# Patient Record
Sex: Male | Born: 1975 | Race: White | Hispanic: No | Marital: Single | Smoking: Current every day smoker
Health system: Southern US, Community
[De-identification: ages and names within clinical notes are randomized; demographics above are authoritative.]

## PROBLEM LIST (undated history)

## (undated) DIAGNOSIS — T07XXXA Unspecified multiple injuries, initial encounter: Secondary | ICD-10-CM

## (undated) DIAGNOSIS — F32A Depression, unspecified: Secondary | ICD-10-CM

## (undated) DIAGNOSIS — F419 Anxiety disorder, unspecified: Secondary | ICD-10-CM

## (undated) DIAGNOSIS — F329 Major depressive disorder, single episode, unspecified: Secondary | ICD-10-CM

## (undated) HISTORY — PX: NO PAST SURGERIES: SHX2092

## (undated) HISTORY — DX: Unspecified multiple injuries, initial encounter: T07.XXXA

## (undated) HISTORY — DX: Depression, unspecified: F32.A

## (undated) HISTORY — DX: Major depressive disorder, single episode, unspecified: F32.9

## (undated) HISTORY — DX: Anxiety disorder, unspecified: F41.9

---

## 2011-10-03 ENCOUNTER — Emergency Department (HOSPITAL_COMMUNITY)
Admission: EM | Admit: 2011-10-03 | Discharge: 2011-10-03 | Disposition: A | Discharge status: Home / Self Care | Payer: Self-pay | Attending: Emergency Medicine | Admitting: Emergency Medicine

## 2011-10-03 ENCOUNTER — Encounter (HOSPITAL_COMMUNITY): Payer: Self-pay | Admitting: Emergency Medicine

## 2011-10-03 DIAGNOSIS — S61409A Unspecified open wound of unspecified hand, initial encounter: Secondary | ICD-10-CM | POA: Insufficient documentation

## 2011-10-03 DIAGNOSIS — T148XXA Other injury of unspecified body region, initial encounter: Secondary | ICD-10-CM

## 2011-10-03 DIAGNOSIS — Z23 Encounter for immunization: Secondary | ICD-10-CM | POA: Insufficient documentation

## 2011-10-03 DIAGNOSIS — Y92009 Unspecified place in unspecified non-institutional (private) residence as the place of occurrence of the external cause: Secondary | ICD-10-CM | POA: Insufficient documentation

## 2011-10-03 DIAGNOSIS — IMO0001 Reserved for inherently not codable concepts without codable children: Secondary | ICD-10-CM | POA: Insufficient documentation

## 2011-10-03 DIAGNOSIS — F17200 Nicotine dependence, unspecified, uncomplicated: Secondary | ICD-10-CM | POA: Insufficient documentation

## 2011-10-03 MED ORDER — TETANUS-DIPHTH-ACELL PERTUSSIS 5-2.5-18.5 LF-MCG/0.5 IM SUSP
0.5000 mL | Freq: Once | INTRAMUSCULAR | Status: AC
  Administered 2011-10-03: 0.5 mL via INTRAMUSCULAR
  Filled 2011-10-03: qty 0.5

## 2011-10-03 MED ORDER — AMOXICILLIN-POT CLAVULANATE 875-125 MG PO TABS: 1.0000 | ORAL_TABLET | Freq: Two times a day (BID) | ORAL | Status: DC

## 2011-10-03 MED ORDER — AMOXICILLIN-POT CLAVULANATE 875-125 MG PO TABS
1.0000 | ORAL_TABLET | Freq: Once | ORAL | Status: AC
  Administered 2011-10-03: 1 via ORAL
  Filled 2011-10-03: qty 1

## 2011-10-03 NOTE — ED Notes (Signed)
Pt sts was bitten by stray cat on Saturday to right hand; pt with some swelling and pain to palm of hand; pt sts unknown rabies status of stray but pt sts he will refuse the rabies series

## 2011-10-03 NOTE — ED Notes (Signed)
Pt here for cat bite to right hand.

## 2011-10-03 NOTE — ED Provider Notes (Signed)
History  This chart was scribed for Joya Gaskins, MD by Ladona Ridgel Day. This patient was seen in room TR11C/TR11C and the patient's care was started at 1325.   CSN: [id number]  Arrival date & time 10/03/11  1235   None     Chief Complaint  Patient presents with  . Animal Bite   HPI Francisco Lawrence is a 36 y.o. male who presents to the Emergency Department complaining of a cat bite 4 days ago after his daughter brought a local cat inside their house and he got bit while trying to chase it down. He states swelling, erythema, and tenderness to his right hand. His TD vaccine is not UTD. He denies any cough, fever, emesis or weakness. He has no medical history.  PMH - none  History reviewed. No pertinent past surgical history.  History reviewed. No pertinent family history.  History  Substance Use Topics  . Smoking status: Current Every Day Smoker  . Smokeless tobacco: Not on file  . Alcohol Use: Yes     occ      Review of Systems  Constitutional: Negative for fever.  Gastrointestinal: Negative for vomiting.    Allergies  Hydrocodone and Oxycodone  Home Medications   Current Outpatient Rx  Name Route Sig Dispense Refill  . IBUPROFEN 200 MG PO TABS Oral Take 400 mg by mouth every 4 (four) hours as needed. For pain      Triage Vitals: BP 145/97  Pulse 70  Temp 98.2 F (36.8 C) (Oral)  Resp 18  SpO2 99%  Physical Exam CONSTITUTIONAL: Well developed/well nourished HEAD AND FACE: Normocephalic/atraumatic EYES: EOMI/PERRL ENMT: Mucous membranes moist NECK: supple no meningeal signs SPINE:entire spine nontender CV: S1/S2 noted, no murmurs/rubs/gallops noted LUNGS: Lungs are clear to auscultation bilaterally, no apparent distress ABDOMEN: soft, nontender, no rebound or guarding GU:no cva tenderness NEURO: Pt is awake/alert, moves all extremitiesx4 EXTREMITIES: pulses normal, minimal pain with ROM of right index finger SKIN: warm, color normal, Erythema/edema to  palmar aspect of right hand just below right 2nd MCP.  No focal abscess noted.  Small bite marks noted.  No crepitance.  No erythematous streaking.  No pain with passive flex/extension of right index finger PSYCH: no abnormalities of mood noted ED Course  Procedures  DIAGNOSTIC STUDIES: Oxygen Saturation is 99% on room air, normal by my interpretation.    COORDINATION OF CARE: At 200 PM Patient refuses to have rabies series for cat bite on his hand and that he needs to leave ED to pickup his daughter so at this time we cannot finish full patient workup. Discussed risks and consequences of not having rabies series, patient understands. Discussed treatment plan with patient which includes antibiotics and TD vaccine and to return to ED for full workup of his infected hand. Patient agrees. Will need full hand xray and hand consultation when he returns to the ED    MDM  Nursing notes including past medical history and social history reviewed and considered in documentation   I personally performed the services described in this documentation, which was scribed in my presence. The recorded information has been reviewed and considered.          Joya Gaskins, MD 10/03/11 1451

## 2011-10-04 ENCOUNTER — Emergency Department (HOSPITAL_COMMUNITY): Payer: Self-pay

## 2011-10-04 ENCOUNTER — Encounter (HOSPITAL_COMMUNITY): Payer: Self-pay | Admitting: Neurology

## 2011-10-04 ENCOUNTER — Emergency Department (HOSPITAL_COMMUNITY)
Admission: EM | Admit: 2011-10-04 | Discharge: 2011-10-04 | Discharge status: Against Medical Advice | Payer: Self-pay | Attending: Emergency Medicine | Admitting: Emergency Medicine

## 2011-10-04 DIAGNOSIS — L089 Local infection of the skin and subcutaneous tissue, unspecified: Secondary | ICD-10-CM

## 2011-10-04 DIAGNOSIS — Z23 Encounter for immunization: Secondary | ICD-10-CM | POA: Insufficient documentation

## 2011-10-04 DIAGNOSIS — R509 Fever, unspecified: Secondary | ICD-10-CM | POA: Insufficient documentation

## 2011-10-04 DIAGNOSIS — M7989 Other specified soft tissue disorders: Secondary | ICD-10-CM | POA: Insufficient documentation

## 2011-10-04 DIAGNOSIS — S61409A Unspecified open wound of unspecified hand, initial encounter: Secondary | ICD-10-CM | POA: Insufficient documentation

## 2011-10-04 DIAGNOSIS — IMO0001 Reserved for inherently not codable concepts without codable children: Secondary | ICD-10-CM | POA: Insufficient documentation

## 2011-10-04 MED ORDER — METRONIDAZOLE IN NACL 5-0.79 MG/ML-% IV SOLN
500.0000 mg | Freq: Once | INTRAVENOUS | Status: AC
  Administered 2011-10-04: 500 mg via INTRAVENOUS
  Filled 2011-10-04: qty 100

## 2011-10-04 MED ORDER — ACETAMINOPHEN 650 MG RE SUPP: 650.0000 mg | Freq: Once | RECTAL | Status: DC

## 2011-10-04 MED ORDER — AMOXICILLIN-POT CLAVULANATE 875-125 MG PO TABS: 1.0000 | ORAL_TABLET | Freq: Two times a day (BID) | ORAL | Status: DC

## 2011-10-04 MED ORDER — RABIES VACCINE, PCEC IM SUSR
1.0000 mL | Freq: Once | INTRAMUSCULAR | Status: AC
  Administered 2011-10-04: 1 mL via INTRAMUSCULAR
  Filled 2011-10-04: qty 1

## 2011-10-04 MED ORDER — RABIES IMMUNE GLOBULIN 150 UNIT/ML IM INJ
20.0000 [IU]/kg | INJECTION | Freq: Once | INTRAMUSCULAR | Status: AC
  Administered 2011-10-04: 1125 [IU] via INTRAMUSCULAR
  Filled 2011-10-04: qty 8

## 2011-10-04 MED ORDER — LEVOFLOXACIN IN D5W 500 MG/100ML IV SOLN
500.0000 mg | INTRAVENOUS | Status: DC
  Administered 2011-10-04: 500 mg via INTRAVENOUS
  Filled 2011-10-04: qty 100

## 2011-10-04 MED ORDER — CLINDAMYCIN HCL 150 MG PO CAPS: 150.0000 mg | ORAL_CAPSULE | Freq: Four times a day (QID) | ORAL | Status: DC

## 2011-10-04 NOTE — ED Provider Notes (Signed)
Please see my initial note for the patient's course prior to CDU transfer.  I discussed the case with out orthopedist at 1614 again, after the patient had left (prior to complete evaluation).  Gerhard Munch, MD 10/04/11 [phone]

## 2011-10-04 NOTE — ED Notes (Signed)
Pt reporting cat bite to palm of right hand Friday night. Cat was stray neighborhood cat. Pt has swelling to right pointer finger, extending into the knuckle. Skin is red. Sensation intact. A x 4. NAD

## 2011-10-04 NOTE — ED Notes (Signed)
Called pharmacy to tube medications.

## 2011-10-04 NOTE — ED Notes (Signed)
Rabies shot schedule completed and faxed to [facility], pharmacy x 2 and to RN caring for pt at this time.

## 2011-10-04 NOTE — ED Provider Notes (Signed)
History     CSN: [id number]  Arrival date & time 10/04/11  0850   First MD Initiated Contact with Patient 10/04/11 [phone]      Chief Complaint  Patient presents with  . Animal Bite     HPI The patient presents one day after an initial presentation for concerns of right hand pain.  He sustained a cat bite 5 days ago.  Since that time he said pain persistently on the palmar aspect near the MCP.  He notes that since his presentation is really related to abuse oral antibiotics the swelling and pain is decreased slightly, but he continues to have sharp pain in the area.  He denies any new fever, nausea, vomiting, confusion or disorientation or any other focal complaints. He had to leave prematurely yesterday, due to family needs.  Today he requests completion of care including rabies prophylaxis, continue antibiotics, x-ray. History reviewed. No pertinent past medical history.  History reviewed. No pertinent past surgical history.  No family history on file.  History  Substance Use Topics  . Smoking status: Current Every Day Smoker  . Smokeless tobacco: Not on file  . Alcohol Use: Yes     occ      Review of Systems  Constitutional:       Per HPI, otherwise negative  HENT:       Per HPI, otherwise negative  Eyes: Negative.   Respiratory:       Per HPI, otherwise negative  Cardiovascular:       Per HPI, otherwise negative  Gastrointestinal: Negative for vomiting.  Musculoskeletal:       Per HPI, otherwise negative  Skin: Negative.   Neurological: Negative for weakness and numbness.  Hematological:       No additional bruising    Allergies  Hydrocodone and Oxycodone  Home Medications   Current Outpatient Rx  Name Route Sig Dispense Refill  . IBUPROFEN 200 MG PO TABS Oral Take 400 mg by mouth every 6 (six) hours as needed. For pain      BP 121/67  Pulse 79  Temp 98.4 F (36.9 C) (Oral)  Resp 14  SpO2 98%  Physical Exam  Nursing note and vitals  reviewed. Constitutional: He appears well-developed and well-nourished. No distress.  HENT:  Head: Normocephalic.  Eyes: Conjunctivae normal are normal. Right eye exhibits no discharge. Left eye exhibits no discharge.  Neck: No tracheal deviation present.  Cardiovascular: Normal rate, regular rhythm and intact distal pulses.   Pulmonary/Chest: Effort normal. No stridor. No respiratory distress.  Musculoskeletal:       Arms:   ED Course  Procedures (including critical care time)  Labs Reviewed - No data to display No results found.   No diagnosis found.    MDM  This previously well male presents after a cat bite sustained several days ago.  Notably, the patient was initially evaluated for this complaint yesterday, but had to leave due to family concerns.  Today the patient received IV antibiotics, rabies prophylaxis, and after a x-ray did not show obvious concerns rusty mellitus was transferred to the CDU awaiting hand surgery evaluation to concern for tendinous sheath involvement.  I discussed the case with the mid-level provider in the CDU prior to transfer.  Gerhard Munch, MD 10/04/11 [phone]

## 2011-10-04 NOTE — ED Notes (Signed)
RABIES VACCINE total of 7.5 ml.  0.5-hand;0.5 hand;0.5 hand;0.5 hand= 2 ml 2 ml right thigh; 2 ml left thigh=4 1.5 to left deltoid; TOTAL= 7.5

## 2011-10-04 NOTE — ED Provider Notes (Signed)
Pt to CDU while awaits Dr. Melvyn Novas from Hand to evaluate.  Pt sustained a cat bite to R hand and suffered deep tissue infection.  Pt will be placed on Levaquin and Flagyl IV abx.  Pt has a temp of 102.  Will give acetaminophen suppository, will keep NPO until seen by hand specialist.    2:01 PM Pt has been waiting for several hours but has not seen Dr. Melvyn Novas yet.  He request to leave AMA to pick up his daughter from day care.  I discussed risk/benefit including delay in treatment, prolonged complications, worsening infection.  Pt acknowledge risk.  I will fill AMA form.  WIl prescribe Augmentin as well as pain medication.  Will give number to hand specialist for f/u, and also to encourage to return to ER if sxs worsen.  Pt voice understanding and agrees with plan.  Pt able to make informed decision.    BP 129/72  Pulse 77  Temp 102.1 F (38.9 C) (Oral)  Resp 28  Wt 128 lb (58.06 kg)  SpO2 97%  Nursing notes reviewed and considered in documentation  Previous records reviewed and considered  All labs/vitals reviewed and considered  xrays reviewed and considered   Fayrene Helper, PA-C 10/04/11 1403

## 2011-10-04 NOTE — ED Notes (Signed)
Pt.  Unable to stay and wait for the hand surgeon, due to having to pick up their daughter.  They will call the hand surgeon and follow-up

## 2016-06-21 ENCOUNTER — Emergency Department (HOSPITAL_COMMUNITY)
Admission: EM | Admit: 2016-06-21 | Discharge: 2016-06-21 | Disposition: A | Discharge status: Home / Self Care | Payer: Self-pay | Attending: Emergency Medicine | Admitting: Emergency Medicine

## 2016-06-21 ENCOUNTER — Encounter (HOSPITAL_COMMUNITY): Payer: Self-pay | Admitting: Nurse Practitioner

## 2016-06-21 ENCOUNTER — Emergency Department (HOSPITAL_COMMUNITY): Payer: Self-pay

## 2016-06-21 DIAGNOSIS — K639 Disease of intestine, unspecified: Secondary | ICD-10-CM

## 2016-06-21 DIAGNOSIS — R1031 Right lower quadrant pain: Secondary | ICD-10-CM

## 2016-06-21 DIAGNOSIS — K6389 Other specified diseases of intestine: Secondary | ICD-10-CM | POA: Insufficient documentation

## 2016-06-21 DIAGNOSIS — Z79899 Other long term (current) drug therapy: Secondary | ICD-10-CM | POA: Insufficient documentation

## 2016-06-21 DIAGNOSIS — F17200 Nicotine dependence, unspecified, uncomplicated: Secondary | ICD-10-CM | POA: Insufficient documentation

## 2016-06-21 LAB — URINALYSIS, ROUTINE W REFLEX MICROSCOPIC
BILIRUBIN URINE: NEGATIVE
GLUCOSE, UA: NEGATIVE mg/dL
Hgb urine dipstick: NEGATIVE
KETONES UR: 5 mg/dL — AB
Leukocytes, UA: NEGATIVE
Nitrite: NEGATIVE
Protein, ur: NEGATIVE mg/dL
SPECIFIC GRAVITY, URINE: 1.028 (ref 1.005–1.030)
pH: 5 (ref 5.0–8.0)

## 2016-06-21 LAB — COMPREHENSIVE METABOLIC PANEL
ALK PHOS: 46 U/L (ref 38–126)
ALT: 12 U/L — AB (ref 17–63)
ANION GAP: 9 (ref 5–15)
AST: 17 U/L (ref 15–41)
Albumin: 4.1 g/dL (ref 3.5–5.0)
BUN: 13 mg/dL (ref 6–20)
CHLORIDE: 102 mmol/L (ref 101–111)
CO2: 27 mmol/L (ref 22–32)
Calcium: 9.5 mg/dL (ref 8.9–10.3)
Creatinine, Ser: 1.03 mg/dL (ref 0.61–1.24)
GFR calc non Af Amer: >60 mL/min (ref >60)
GLUCOSE: 81 mg/dL (ref 65–99)
Potassium: 4.2 mmol/L (ref 3.5–5.1)
Sodium: 138 mmol/L (ref 135–145)
Total Bilirubin: 0.6 mg/dL (ref 0.3–1.2)
Total Protein: 6.9 g/dL (ref 6.5–8.1)

## 2016-06-21 LAB — CBC
HCT: 45.6 % (ref 39.0–52.0)
HEMOGLOBIN: 15.8 g/dL (ref 13.0–17.0)
MCH: 33.3 pg (ref 26.0–34.0)
MCHC: 34.6 g/dL (ref 30.0–36.0)
MCV: 96 fL (ref 78.0–100.0)
Platelets: 208 10*3/uL (ref 150–400)
RBC: 4.75 MIL/uL (ref 4.22–5.81)
RDW: 12.8 % (ref 11.5–15.5)
WBC: 10.3 10*3/uL (ref 4.0–10.5)

## 2016-06-21 MED ORDER — IOPAMIDOL (ISOVUE-300) INJECTION 61%
INTRAVENOUS | Status: AC
  Administered 2016-06-21: 100 mL
  Filled 2016-06-21: qty 100

## 2016-06-21 NOTE — ED Triage Notes (Signed)
Pt presents with c/o abdominal pain. The pain is a squeezing pain in his right lower quadrant that has been intermittent over the past three weeks.The pain became so severe last night that he had to leave work. He reports difficulty passing gas, constipation. he's passed small stools but no normal bowel movement for several weeks. He denies fevers, chills, nausea, vomiting, urinary frequency, dysuria, diarrhea. He has not tried anything for the pain at home.

## 2016-06-23 ENCOUNTER — Encounter: Payer: Self-pay | Admitting: Gastroenterology

## 2016-06-23 NOTE — ED Provider Notes (Signed)
[facility] DEPT Provider Note   CSN: [id number] Arrival date & time: 06/21/16  1628     History   Chief Complaint Chief Complaint  Patient presents with  . Abdominal Pain    HPI Francisco Lawrence is a 41 y.o. male.  HPI   41 year old male presents with concern for intermittent squeezing right lower quadrant pain over the last 3 weeks. Reports that the pain has been increasing, and last night it was so severe he had to leave work. Reports it has been difficult to pass flatus, and he's had constipation. He's had small, thin stool but no normal bowel movements for several weeks. Denies nausea, vomiting, dysuria, diarrhea. Denies fevers. Reports his appetite has decreased. He is not tried anything for the pain.  History reviewed. No pertinent past medical history.  There are no active problems to display for this patient.   History reviewed. No pertinent surgical history.     Home Medications    Prior to Admission medications   Medication Sig Start Date End Date Taking? Authorizing Provider  amoxicillin-clavulanate (AUGMENTIN) 875-125 MG per tablet Take 1 tablet by mouth 2 (two) times daily. 10/04/11   Domenic Moras, PA-C  clindamycin (CLEOCIN) 150 MG capsule Take 1 capsule (150 mg total) by mouth every 6 (six) hours. 10/04/11   Domenic Moras, PA-C  ibuprofen (ADVIL,MOTRIN) 200 MG tablet Take 400 mg by mouth every 6 (six) hours as needed. For pain    [provider]    Family History History reviewed. No pertinent family history.  Social History Social History  Substance Use Topics  . Smoking status: Current Every Day Smoker  . Smokeless tobacco: Never Used  . Alcohol use Yes     Comment: occ     Allergies   Hydrocodone and Oxycodone   Review of Systems Review of Systems  Constitutional: Negative for fever.  HENT: Negative for sore throat.   Eyes: Negative for visual disturbance.  Respiratory: Negative for shortness of breath.   Cardiovascular: Negative for  chest pain.  Gastrointestinal: Positive for abdominal pain and constipation. Negative for diarrhea, nausea and vomiting.  Genitourinary: Negative for difficulty urinating and dysuria.  Musculoskeletal: Negative for back pain and neck stiffness.  Skin: Negative for rash.  Neurological: Negative for syncope and headaches.     Physical Exam Updated Vital Signs BP 112/71   Pulse (!) 54   Temp 97.4 F (36.3 C) (Oral)   Resp 16   SpO2 98%   Physical Exam  Constitutional: He is oriented to person, place, and time. He appears well-developed and well-nourished. No distress.  HENT:  Head: Normocephalic and atraumatic.  Eyes: Conjunctivae and EOM are normal.  Neck: Normal range of motion.  Cardiovascular: Normal rate, regular rhythm, normal heart sounds and intact distal pulses.  Exam reveals no gallop and no friction rub.   No murmur heard. Pulmonary/Chest: Effort normal and breath sounds normal. No respiratory distress. He has no wheezes. He has no rales.  Abdominal: Soft. He exhibits no distension. There is tenderness (right sided). There is no guarding.  Musculoskeletal: He exhibits no edema.  Neurological: He is alert and oriented to person, place, and time.  Skin: Skin is warm and dry. He is not diaphoretic.  Nursing note and vitals reviewed.    ED Treatments / Results  Labs (all labs ordered are listed, but only abnormal results are displayed) Labs Reviewed  COMPREHENSIVE METABOLIC PANEL - Abnormal; Notable for the following:       Result Value  ALT 12 (*)    All other components within normal limits  URINALYSIS, ROUTINE W REFLEX MICROSCOPIC - Abnormal; Notable for the following:    APPearance HAZY (*)    Ketones, ur 5 (*)    All other components within normal limits  CBC    EKG  EKG Interpretation None       Radiology Ct Abdomen Pelvis W Contrast  Result Date: 06/21/2016 CLINICAL DATA:  Right-sided abdominal pain. Intermittent constipation. Symptoms for 3  weeks. Clinical concern for intermittent obstruction/mass. EXAM: CT ABDOMEN AND PELVIS WITH CONTRAST TECHNIQUE: Multidetector CT imaging of the abdomen and pelvis was performed using the standard protocol following bolus administration of intravenous contrast. CONTRAST:  150mL ISOVUE-300 IOPAMIDOL (ISOVUE-300) INJECTION 61% COMPARISON:  None. FINDINGS: Lower chest: The lung bases are clear. Hepatobiliary: No focal liver abnormality is seen. No gallstones, gallbladder wall thickening, or biliary dilatation. Pancreas: No ductal dilatation or inflammation. Spleen: Normal in size without focal abnormality. Adrenals/Urinary Tract: Normal adrenal glands. No hydronephrosis or perinephric edema. Homogeneous enhancement with symmetric excretion on delayed phase imaging. 15 mm cyst in the mid left kidney is minimally complex. Subcentimeter hypodensities in the anterior mid left kidney as well as inferior right kidney are too small to characterize. Urinary bladder is minimally distended without wall thickening. Stomach/Bowel: Bowel evaluation is limited given lack of enteric contrast and paucity of intra-abdominal fat. No evidence appendicitis. Air-filled appendix without surrounding inflammation. Ingested material within the stomach. No bowel obstruction or abnormal distention. Majority of the colon is decompressed, particularly sigmoid colon, difficult to exclude sigmoid colonic wall thickening. Vascular/Lymphatic: Circumaortic left renal vein. Abdominal aorta is normal in caliber. No evidence of adenopathy allowing for limitations of paucity of intra-abdominal fat and lack of enteric contrast. Reproductive: Prostate is unremarkable. Other: Trace free fluid in the pelvis is nonspecific. No free air or intra-abdominal abscess. Musculoskeletal: There are no acute or suspicious osseous abnormalities. IMPRESSION: 1. Possible sigmoid colonic wall thickening versus nondistention, difficult to exclude mild colitis. Otherwise no  acute abnormality in the abdomen or pelvis. The appendix is normal. 2. No bowel obstruction. Detailed bowel evaluation is limited in the absence of enteric contrast. Electronically Signed   By: Jeb Levering M.D.   On: 06/21/2016 21:08    Procedures Procedures (including critical care time)  Medications Ordered in ED Medications  iopamidol (ISOVUE-300) 61 % injection (100 mLs  Contrast Given 06/21/16 2031)     Initial Impression / Assessment and Plan / ED Course  I have reviewed the triage vital signs and the nursing notes.  Pertinent labs & imaging results that were available during my care of the patient were reviewed by me and considered in my medical decision making (see chart for details).     41 year old male presents with concern for intermittent right abdominal pain which has been worsening, and associated with constipation decreased flatus. Labs showed no significant changes.   CT shows possible sigmoid colonic wall thickening versus nondistention. Given symptoms, recommend follow-up with gastroenterology for outpatient colonoscopy.  Patient discharged in stable condition with understanding of reasons to return.   Final Clinical Impressions(s) / ED Diagnoses   Final diagnoses:  Right lower quadrant abdominal pain  Sigmoid thickening, colitis versus other    New Prescriptions Discharge Medication List as of 06/21/2016  9:46 PM       Gareth Morgan, MD 06/23/16 0236

## 2016-06-29 ENCOUNTER — Ambulatory Visit (INDEPENDENT_AMBULATORY_CARE_PROVIDER_SITE_OTHER): Payer: Self-pay | Admitting: Gastroenterology

## 2016-06-29 ENCOUNTER — Other Ambulatory Visit (INDEPENDENT_AMBULATORY_CARE_PROVIDER_SITE_OTHER): Payer: Self-pay

## 2016-06-29 ENCOUNTER — Encounter: Payer: Self-pay | Admitting: Gastroenterology

## 2016-06-29 VITALS — BP 116/70 | HR 76 | Ht 67.5 in | Wt 121.0 lb

## 2016-06-29 DIAGNOSIS — R140 Abdominal distension (gaseous): Secondary | ICD-10-CM

## 2016-06-29 DIAGNOSIS — R634 Abnormal weight loss: Secondary | ICD-10-CM

## 2016-06-29 DIAGNOSIS — R630 Anorexia: Secondary | ICD-10-CM

## 2016-06-29 DIAGNOSIS — R1031 Right lower quadrant pain: Secondary | ICD-10-CM

## 2016-06-29 DIAGNOSIS — K648 Other hemorrhoids: Secondary | ICD-10-CM

## 2016-06-29 DIAGNOSIS — K5900 Constipation, unspecified: Secondary | ICD-10-CM

## 2016-06-29 LAB — VITAMIN B12: Vitamin B-12: 569 pg/mL (ref 211–911)

## 2016-06-29 LAB — IBC PANEL
Iron: 69 ug/dL (ref 42–165)
Saturation Ratios: 21.6 % (ref 20.0–50.0)
Transferrin: 228 mg/dL (ref 212.0–360.0)

## 2016-06-29 LAB — FERRITIN: Ferritin: 82.9 ng/mL (ref 22.0–322.0)

## 2016-06-29 LAB — FOLATE: FOLATE: 13.1 ng/mL (ref >5.9)

## 2016-06-29 LAB — HIGH SENSITIVITY CRP: CRP HIGH SENSITIVITY: 0.25 mg/L (ref 0.000–5.000)

## 2016-06-29 NOTE — Progress Notes (Signed)
Francisco Lawrence    [id number]    10/01/1975  Primary Care Physician:Patient, No Pcp Per  Referring Physician: No referring provider defined for this encounter.  Chief complaint:  Weight loss  HPI: 41 year old male here for new patient visit, accompanied by his daughter with complaints of right lower quadrant abdominal pain, excessive bloating and 10 pound weight loss in the past 1 year. He has been having intermittent right lower quadrant abdominal pain for past 2-3 years, was in the ER recently 06/21/2016, had labs and CT abdomen pelvis without contrast that showed thickening of sigmoid colon versus and under distention otherwise unremarkable exam He has chronic constipation with difficulty moving bowels or passing gas, associated with severe bloating. He has bowel movements 4-5 times a week. Complaints of decreased appetite, feels full even after small meals, abdominal bloating worse postprandial. Denies any nausea or vomiting. Review of system positive for heartburn and epigastric abdominal pain intermittently. No dysphagia or odynophagia. He also complained of nodule in the rectum, protrusion of tissue. No mucus or blood per rectum. No family history of IBD, colon cancer, gastric cancer or any GI malignancy   Outpatient Encounter Prescriptions as of 06/29/2016  Medication Sig  . [DISCONTINUED] amoxicillin-clavulanate (AUGMENTIN) 875-125 MG per tablet Take 1 tablet by mouth 2 (two) times daily.  . [DISCONTINUED] clindamycin (CLEOCIN) 150 MG capsule Take 1 capsule (150 mg total) by mouth every 6 (six) hours.  . [DISCONTINUED] ibuprofen (ADVIL,MOTRIN) 200 MG tablet Take 400 mg by mouth every 6 (six) hours as needed. For pain   No facility-administered encounter medications on file as of 06/29/2016.     Allergies as of 06/29/2016 - Review Complete 06/29/2016  Allergen Reaction Noted  . Hydrocodone Nausea And Vomiting 10/03/2011  . Oxycodone Nausea And Vomiting 10/03/2011     Past Medical History:  Diagnosis Date  . Anxiety   . Depression   . Multiple fractures     Past Surgical History:  Procedure Laterality Date  . NO PAST SURGERIES      Family History  Problem Relation Age of Onset  . Diabetes Mother   . Heart defect Maternal Uncle   . Other Maternal Uncle        brain tumor    Social History   Social History  . Marital status: Single    Spouse name: N/A  . Number of children: 2  . Years of education: N/A   Occupational History  . Animal nutritionist    Social History Main Topics  . Smoking status: Current Every Day Smoker  . Smokeless tobacco: Never Used  . Alcohol use Yes     Comment: occ  . Drug use: No  . Sexual activity: Not on file   Other Topics Concern  . Not on file   Social History Narrative  . No narrative on file      Review of systems: Review of Systems  Constitutional: Negative for fever and chills.  HENT: Negative.   Eyes: Negative for blurred vision.  Respiratory: Negative for cough, shortness of breath and wheezing.   Cardiovascular: Negative for chest pain and palpitations.  Gastrointestinal: as per HPI Genitourinary: Negative for dysuria, urgency, frequency and hematuria.  Musculoskeletal: Negative for myalgias, back pain and joint pain.  Skin: Negative for itching and rash.  Neurological: Negative for dizziness, tremors, focal weakness, seizures and loss of consciousness.  Endo/Heme/Allergies: Positive for seasonal allergies.  Psychiatric/Behavioral: Negative for depression, suicidal ideas and  hallucinations.  All other systems reviewed and are negative.   Physical Exam: Vitals:   06/29/16 0832  BP: 116/70  Pulse: 76   Body mass index is 18.67 kg/m. Gen:      No acute distress HEENT:  EOMI, sclera anicteric Neck:     No masses; no thyromegaly Lungs:    Clear to auscultation bilaterally; normal respiratory effort CV:         Regular rate and rhythm; no murmurs Abd:      + bowel sounds;  soft, non-tender; no palpable masses, no distension Ext:    No edema; adequate peripheral perfusion Skin:      Warm and dry; no rash Neuro: alert and oriented x 3 Psych: normal mood and affect Rectal exam: Normal anal sphincter tone, no anal fissure or external hemorrhoids Anoscopy: Small internal hemorrhoids, no active bleeding, normal dentate line, no visible nodules  Data Reviewed:  Reviewed labs, radiology imaging, old records and pertinent past GI work up   Assessment and Plan/Recommendations:  41 year old male here with complaints of intermittent right lower quadrant abdominal pain, bloating, severe constipation, decreased appetite, and 10 pound weight loss in the past 1 year CT abdomen and pelvis without contrast showed possible thickening in the sigmoid colon We will schedule EGD and colonoscopy for evaluation Denies any nausea, vomiting, abdominal pain, melena or bright red blood per rectum  Advise patient to eat small frequent meals Increase dietary fiber and fluid intake to prevent constipation  CBC and CMP within normal limits with no significant abnormality Follow up CRP, ferritin, Iron panel, folate and B12  The risks and benefits as well as alternatives of endoscopic procedure(s) have been discussed and reviewed. All questions answered. The patient agrees to proceed.   Damaris Hippo , MD [phone] Mon-Fri 8a-5p [phone] after 5p, weekends, holidays  CC: No ref. provider found

## 2016-06-29 NOTE — Patient Instructions (Addendum)
Go to the basement for labs today  You have been scheduled for an endoscopy and colonoscopy. Please follow the written instructions given to you at your visit today. Please pick up your prep supplies at the pharmacy within the next 1-3 days. If you use inhalers (even only as needed), please bring them with you on the day of your procedure. Your physician has requested that you go to www.startemmi.com and enter the access code given to you at your visit today. This web site gives a general overview about your procedure. However, you should still follow specific instructions given to you by our office regarding your preparation for the procedure.   We have given you a Suprep sample kit today

## 2016-07-12 ENCOUNTER — Ambulatory Visit (AMBULATORY_SURGERY_CENTER): Payer: Self-pay | Admitting: Gastroenterology

## 2016-07-12 ENCOUNTER — Encounter: Payer: Self-pay | Admitting: Gastroenterology

## 2016-07-12 VITALS — BP 118/73 | HR 72 | Temp 99.3°F | Resp 13 | Ht 67.5 in | Wt 121.0 lb

## 2016-07-12 DIAGNOSIS — K621 Rectal polyp: Secondary | ICD-10-CM

## 2016-07-12 DIAGNOSIS — D128 Benign neoplasm of rectum: Secondary | ICD-10-CM

## 2016-07-12 DIAGNOSIS — D125 Benign neoplasm of sigmoid colon: Secondary | ICD-10-CM

## 2016-07-12 DIAGNOSIS — R1031 Right lower quadrant pain: Secondary | ICD-10-CM

## 2016-07-12 DIAGNOSIS — R933 Abnormal findings on diagnostic imaging of other parts of digestive tract: Secondary | ICD-10-CM

## 2016-07-12 DIAGNOSIS — K2900 Acute gastritis without bleeding: Secondary | ICD-10-CM

## 2016-07-12 MED ORDER — SODIUM CHLORIDE 0.9 % IV SOLN
500.0000 mL | INTRAVENOUS | Status: AC
Start: 2016-07-12 — End: ?

## 2016-07-12 NOTE — Progress Notes (Signed)
Called to room to assist during endoscopic procedure.  Patient ID and intended procedure confirmed with present staff. Received instructions for my participation in the procedure from the performing physician.  

## 2016-07-12 NOTE — Progress Notes (Signed)
A and O x3. Report to RN. Tolerated MAC anesthesia well.Gums unchanged after procedure. 

## 2016-07-12 NOTE — Op Note (Signed)
[facility] [place] Patient Name: Francisco Lawrence Procedure Date: 07/12/2016 2:35 PM MRN: [id number] Endoscopist: Mauri Pole , MD Age: 41 Referring MD:  Date of Birth: December 07, 1975 Gender: Male Account #: [id number] Procedure:                Colonoscopy Indications:              Abnormal CT of the GI tract, Weight loss Medicines:                Monitored Anesthesia Care Procedure:                Pre-Anesthesia Assessment:                           - Prior to the procedure, a History and Physical                            was performed, and patient medications and                            allergies were reviewed. The patient's tolerance of                            previous anesthesia was also reviewed. The risks                            and benefits of the procedure and the sedation                            options and risks were discussed with the patient.                            All questions were answered, and informed consent                            was obtained. Prior Anticoagulants: The patient has                            taken no previous anticoagulant or antiplatelet                            agents. ASA Grade Assessment: II - A patient with                            mild systemic disease. After reviewing the risks                            and benefits, the patient was deemed in                            satisfactory condition to undergo the procedure.                           After obtaining informed consent, the colonoscope  was passed under direct vision. Throughout the                            procedure, the patient's blood pressure, pulse, and                            oxygen saturations were monitored continuously. The                            Colonoscope was introduced through the anus and                            advanced to the the cecum, identified by                            appendiceal orifice and  ileocecal valve. The                            colonoscopy was performed without difficulty. The                            patient tolerated the procedure well. The quality                            of the bowel preparation was excellent. The                            ileocecal valve, appendiceal orifice, and rectum                            were photographed. Scope In: 2:49:31 PM Scope Out: 3:05:08 PM Scope Withdrawal Time: 0 hours 11 minutes 32 seconds  Total Procedure Duration: 0 hours 15 minutes 37 seconds  Findings:                 The perianal and digital rectal examinations were                            normal.                           Three sessile polyps were found in the rectum. The                            polyps were 1 to 2 mm in size. These polyps were                            removed with a cold biopsy forceps. Resection and                            retrieval were complete.                           A 8 mm polyp was found in the sigmoid colon. The  polyp was sessile. The polyp was removed with a                            cold snare. Resection and retrieval were complete.                           A few small-mouthed diverticula were found in the                            sigmoid colon and descending colon.                           Non-bleeding internal hemorrhoids were found during                            retroflexion. The hemorrhoids were small.                           The exam was otherwise without abnormality. Complications:            No immediate complications. Estimated Blood Loss:     Estimated blood loss was minimal. Impression:               - Three 1 to 2 mm polyps in the rectum, removed                            with a cold biopsy forceps. Resected and retrieved.                           - One 8 mm polyp in the sigmoid colon, removed with                            a cold snare. Resected and retrieved.                            - Diverticulosis in the sigmoid colon and in the                            descending colon.                           - Non-bleeding internal hemorrhoids.                           - The examination was otherwise normal. Recommendation:           - Patient has a contact number available for                            emergencies. The signs and symptoms of potential                            delayed complications were discussed with the  patient. Return to normal activities tomorrow.                            Written discharge instructions were provided to the                            patient.                           - Resume previous diet.                           - Continue present medications.                           - Await pathology results.                           - Repeat colonoscopy in 5-10 years for surveillance                            based on pathology results. Mauri Pole, MD 07/12/2016 3:14:17 PM This report has been signed electronically.

## 2016-07-12 NOTE — Patient Instructions (Signed)
YOU HAD AN ENDOSCOPIC PROCEDURE TODAY AT [facility]:   Refer to the procedure report that was given to you for any specific questions about what was found during the examination.  If the procedure report does not answer your questions, please call your gastroenterologist to clarify.  If you requested that your care partner not be given the details of your procedure findings, then the procedure report has been included in a sealed envelope for you to review at your convenience later.  YOU SHOULD EXPECT: Some feelings of bloating in the abdomen. Passage of more gas than usual.  Walking can help get rid of the air that was put into your GI tract during the procedure and reduce the bloating. If you had a lower endoscopy (such as a colonoscopy or flexible sigmoidoscopy) you may notice spotting of blood in your stool or on the toilet paper. If you underwent a bowel prep for your procedure, you may not have a normal bowel movement for a few days.  Please Note:  You might notice some irritation and congestion in your nose or some drainage.  This is from the oxygen used during your procedure.  There is no need for concern and it should clear up in a day or so.  SYMPTOMS TO REPORT IMMEDIATELY:   Following lower endoscopy (colonoscopy or flexible sigmoidoscopy):  Excessive amounts of blood in the stool  Significant tenderness or worsening of abdominal pains  Swelling of the abdomen that is new, acute  Fever of 100F or higher   Following upper endoscopy (EGD)  Vomiting of blood or coffee ground material  New chest pain or pain under the shoulder blades  Painful or persistently difficult swallowing  New shortness of breath  Fever of 100F or higher  Black, tarry-looking stools  For urgent or emergent issues, a gastroenterologist can be reached at any hour by calling [phone].   DIET:  We do recommend a small meal at first, but then you may proceed to your regular diet.  Drink  plenty of fluids but you should avoid alcoholic beverages for 24 hours.  MEDICATIONS: Continue present medications. No Aspirin, Ibuprofen, Naproxen, or other non-steroidal anti-inflammatory drugs.  Please see handouts given to you by your recovery nurse.  Return to Dr. Woodward Ku office August 7th, 2018 at 2:30 pm for follow up.  ACTIVITY:  You should plan to take it easy for the rest of today and you should NOT DRIVE or use heavy machinery until tomorrow (because of the sedation medicines used during the test).    FOLLOW UP: Our staff will call the number listed on your records the next business day following your procedure to check on you and address any questions or concerns that you may have regarding the information given to you following your procedure. If we do not reach you, we will leave a message.  However, if you are feeling well and you are not experiencing any problems, there is no need to return our call.  We will assume that you have returned to your regular daily activities without incident.  If any biopsies were taken you will be contacted by phone or by letter within the next 1-3 weeks.  Please call us at [phone] if you have not heard about the biopsies in 3 weeks.   Thank you for allowing [place] to provide for your healthcare needs today.   SIGNATURES/CONFIDENTIALITY: You and/or your care partner have signed paperwork which will be entered into your electronic  medical record.  These signatures attest to the fact that that the information above on your After Visit Summary has been reviewed and is understood.  Full responsibility of the confidentiality of this discharge information lies with you and/or your care-partner.

## 2016-07-12 NOTE — Op Note (Signed)
[name] Procedure Date: 07/12/2016 2:35 PM MRN: [id number] Endoscopist: Mauri Pole , MD Age: 41 Referring MD:  Date of Birth: 05-23-1975 Gender: Male Account #: [id number] Procedure:                Upper GI endoscopy Indications:              Upper abdominal symptoms that persist despite an                            appropriate trial of therapy, Upper abdominal                            symptoms associated with anorexia (suggesting                            structural disease), Upper abdominal symptoms                            associated with weight loss (suggesting structural                            disease), Epigastric abdominal pain, Abdominal pain                            in the right upper quadrant Medicines:                Monitored Anesthesia Care Procedure:                Pre-Anesthesia Assessment:                           - Prior to the procedure, a History and Physical                            was performed, and patient medications and                            allergies were reviewed. The patient's tolerance of                            previous anesthesia was also reviewed. The risks                            and benefits of the procedure and the sedation                            options and risks were discussed with the patient.                            All questions were answered, and informed consent                            was obtained. Prior Anticoagulants: The patient has  taken no previous anticoagulant or antiplatelet                            agents. ASA Grade Assessment: II - A patient with                            mild systemic disease. After reviewing the risks                            and benefits, the patient was deemed in                            satisfactory condition to undergo the procedure.                           After obtaining informed consent,  the endoscope was                            passed under direct vision. Throughout the                            procedure, the patient's blood pressure, pulse, and                            oxygen saturations were monitored continuously. The                            Model GIF-[id number] scope was introduced                            through the mouth, and advanced to the second part                            of duodenum. The upper GI endoscopy was                            accomplished without difficulty. The patient                            tolerated the procedure well. Scope In: Scope Out: Findings:                 The esophagus was normal.                           Scattered moderate inflammation characterized by                            congestion (edema), erosions, erythema, friability                            and mucus was found in the entire examined stomach.                            Biopsies were taken with a cold  forceps for                            Helicobacter pylori testing.                           The examined duodenum was normal. Complications:            No immediate complications. Estimated Blood Loss:     Estimated blood loss was minimal. Impression:               - Normal esophagus.                           - Gastritis. Biopsied.                           - Normal examined duodenum. Recommendation:           - Patient has a contact number available for                            emergencies. The signs and symptoms of potential                            delayed complications were discussed with the                            patient. Return to normal activities tomorrow.                            Written discharge instructions were provided to the                            patient.                           - Resume previous diet.                           - Continue present medications.                           - Await pathology  results.                           - No aspirin, ibuprofen, naproxen, or other                            non-steroidal anti-inflammatory drugs.                           - Return to GI office at the next available                            appointment. Mauri Pole, MD 07/12/2016 3:11:12 PM This report has been signed electronically.

## 2016-07-13 ENCOUNTER — Telehealth: Payer: Self-pay

## 2016-07-13 NOTE — Telephone Encounter (Signed)
  Follow up Call-  Call back number 07/12/2016  Post procedure Call Back phone  # [phone]  Permission to leave phone message Yes  Some recent data might be hidden     Patient questions:  Do you have a fever, pain , or abdominal swelling? No. Pain Score  0 *  Have you tolerated food without any problems? Yes.    Have you been able to return to your normal activities? Yes.    Do you have any questions about your discharge instructions: Diet   No. Medications  No. Follow up visit  No.  Do you have questions or concerns about your Care? No.  Actions: * If pain score is 4 or above: No action needed, pain <4.  No problems noted per pt. maw

## 2016-07-17 ENCOUNTER — Encounter: Payer: Self-pay | Admitting: Gastroenterology

## 2016-08-22 ENCOUNTER — Ambulatory Visit: Payer: Self-pay | Admitting: Gastroenterology

## 2016-08-22 NOTE — Progress Notes (Signed)
No show letter sent.

## 2018-07-13 IMAGING — CT CT ABD-PELV W/ CM
2 of 5 series · 15 of 46 positions shown, 17 images · IV contrast (APPLIED)
Comparison: None.

CLINICAL DATA: Right-sided abdominal pain. Intermittent
constipation. Symptoms for 3 weeks. Clinical concern for
intermittent obstruction/mass.

EXAM:
CT ABDOMEN AND PELVIS WITH CONTRAST
TECHNIQUE: Multidetector CT imaging of the abdomen and pelvis was performed
using the standard protocol following bolus administration of
intravenous contrast.
CONTRAST:  100mL ZBMB40-A00 IOPAMIDOL (ZBMB40-A00) INJECTION 61%

[Series 3: abd/ pelvis 5.0 i30f 2 · axial · 0.68mm/px · z∈[+902,+1282]mm · 12 of 86 slices shown, 14 images]
[im 5/86  soft-tissue]
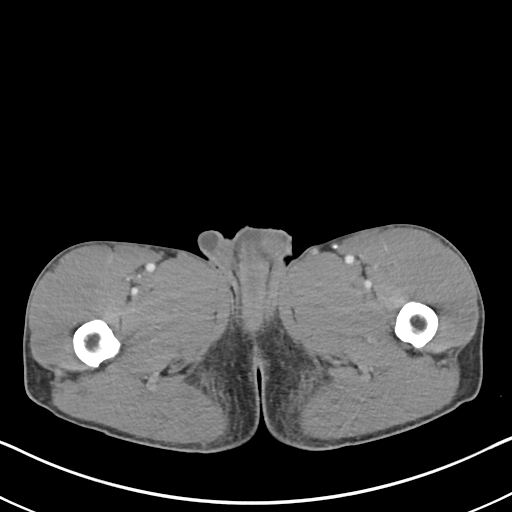
[im 5/86  bone]
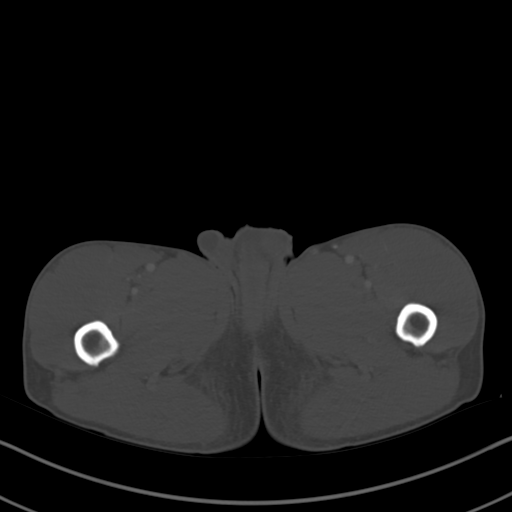
[im 13/86  soft-tissue]
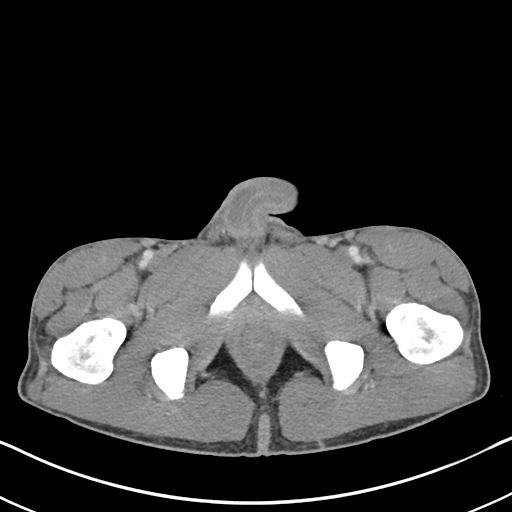
[im 18/86  soft-tissue]
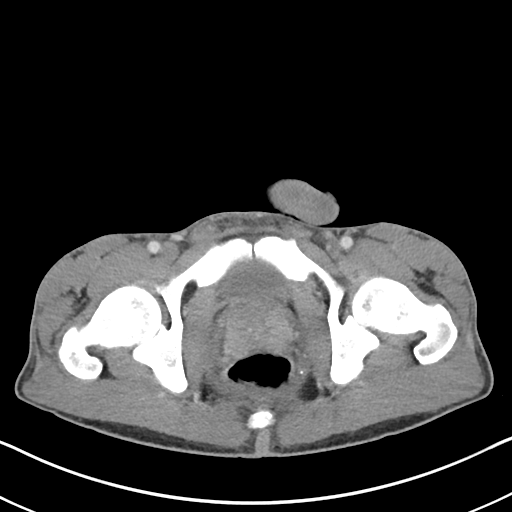
[im 26/86  soft-tissue]
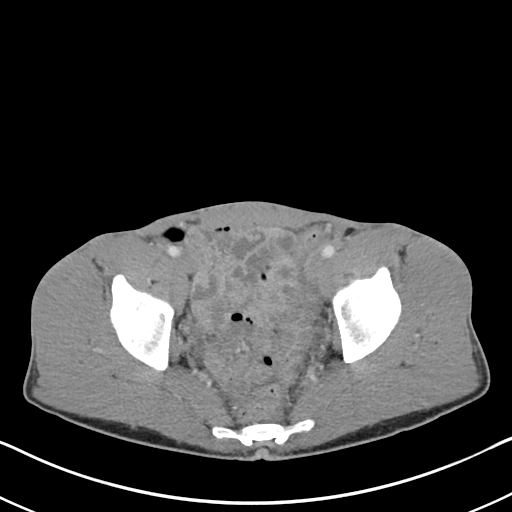
[im 35/86  soft-tissue]
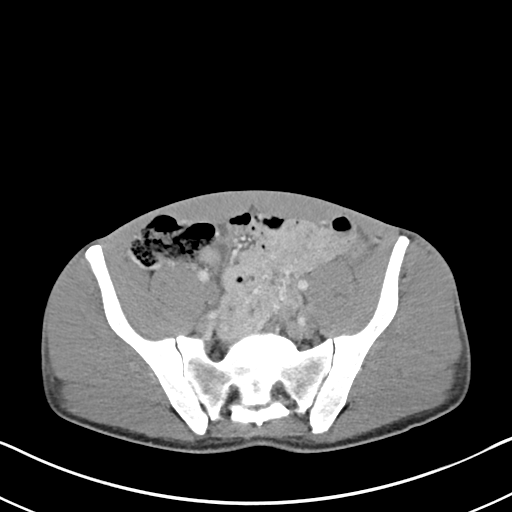
[im 39/86  soft-tissue]
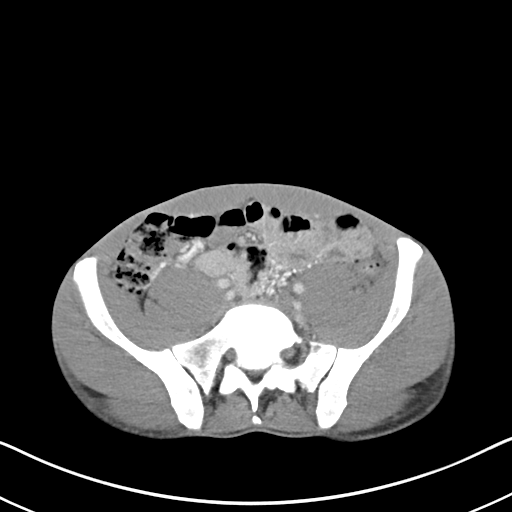
[im 47/86  soft-tissue]
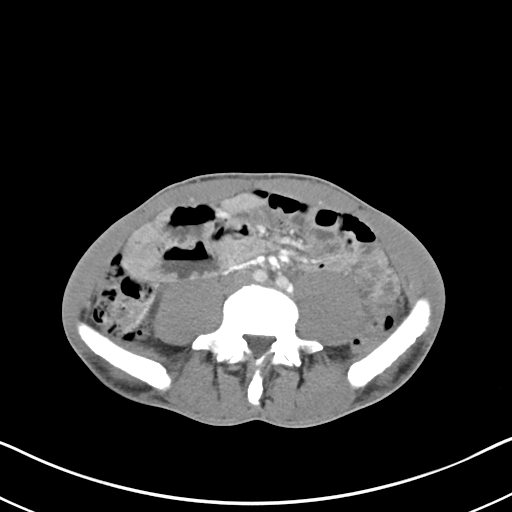
[im 52/86  soft-tissue]
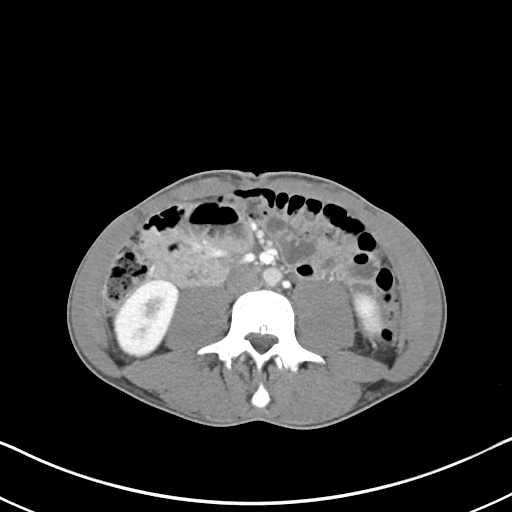
[im 60/86  soft-tissue]
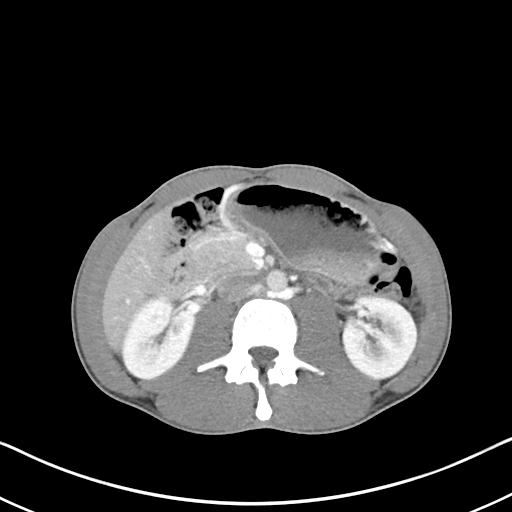
[im 60/86  bone]
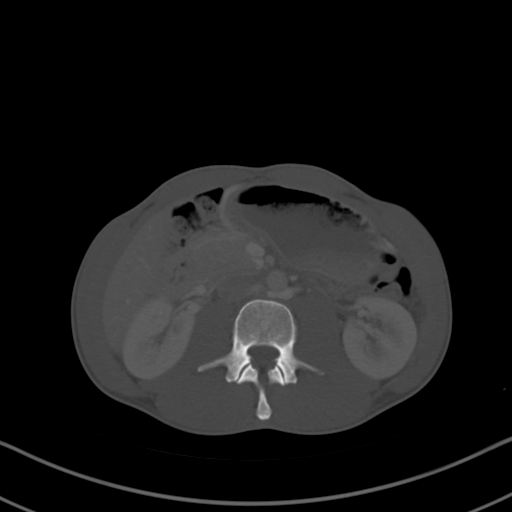
[im 69/86  soft-tissue]
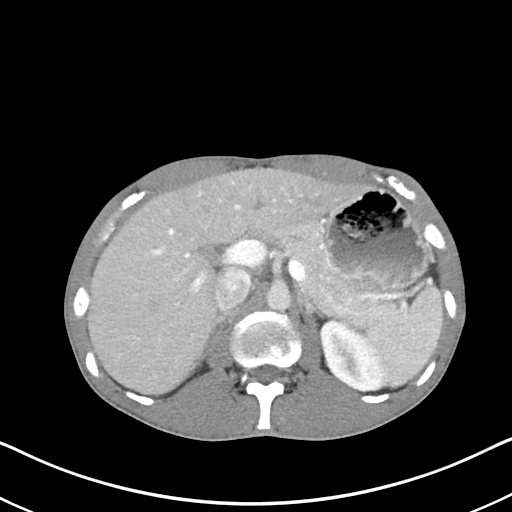
[im 73/86  soft-tissue]
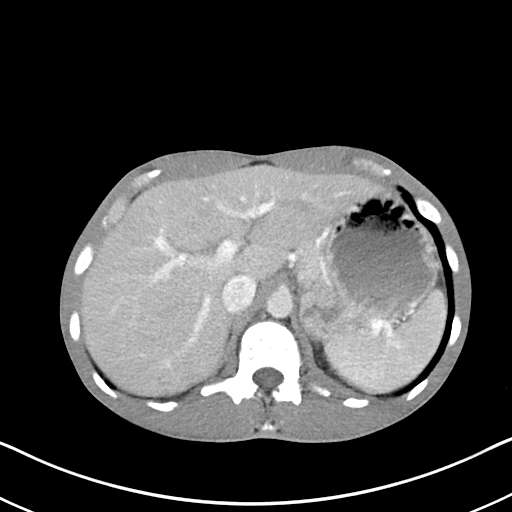
[im 81/86  soft-tissue]
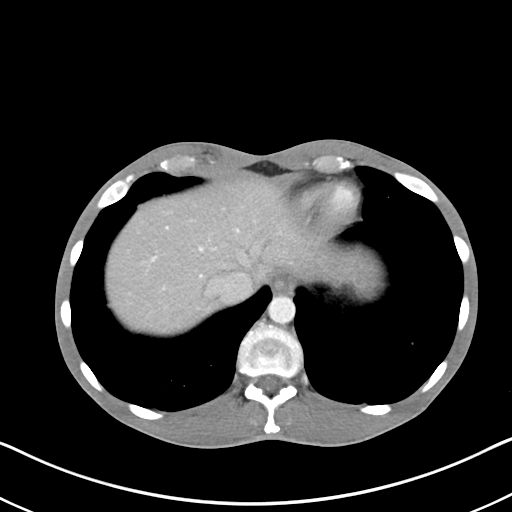

[Series 6: coronal soft tissue · coronal · 0.61mm/px · 3 of 76 slices shown]
[im 26/76  soft-tissue]
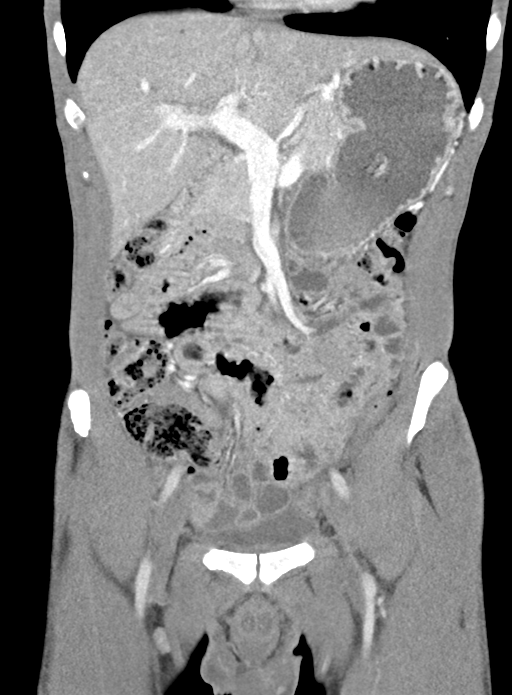
[im 34/76  soft-tissue]
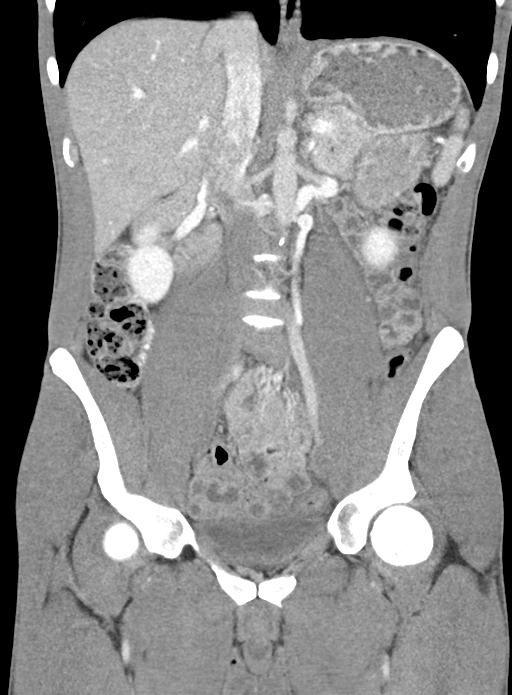
[im 42/76  soft-tissue]
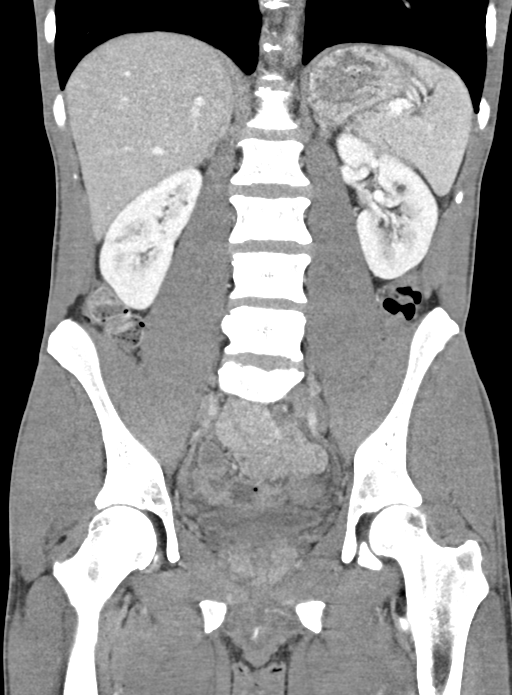

[15 of 46 positions shown; findings below may reference images not displayed]

FINDINGS: Lower chest: The lung bases are clear.

Hepatobiliary: No focal liver abnormality is seen. No gallstones,
gallbladder wall thickening, or biliary dilatation.

Pancreas: No ductal dilatation or inflammation.

Spleen: Normal in size without focal abnormality.

Adrenals/Urinary Tract: Normal adrenal glands. No hydronephrosis or
perinephric edema. Homogeneous enhancement with symmetric excretion
on delayed phase imaging. 15 mm cyst in the mid left kidney is
minimally complex. Subcentimeter hypodensities in the anterior mid
left kidney as well as inferior right kidney are too small to
characterize. Urinary bladder is minimally distended without wall
thickening.

Stomach/Bowel: Bowel evaluation is limited given lack of enteric
contrast and paucity of intra-abdominal fat. No evidence
appendicitis. Air-filled appendix without surrounding inflammation.
Ingested material within the stomach. No bowel obstruction or
abnormal distention. Majority of the colon is decompressed,
particularly sigmoid colon, difficult to exclude sigmoid colonic
wall thickening.

Vascular/Lymphatic: Circumaortic left renal vein. Abdominal aorta is
normal in caliber. No evidence of adenopathy allowing for
limitations of paucity of intra-abdominal fat and lack of enteric
contrast.

Reproductive: Prostate is unremarkable.

Other: Trace free fluid in the pelvis is nonspecific. No free air or
intra-abdominal abscess.

Musculoskeletal: There are no acute or suspicious osseous
abnormalities.
IMPRESSION: 1. Possible sigmoid colonic wall thickening versus nondistention,
difficult to exclude mild colitis. Otherwise no acute abnormality in
the abdomen or pelvis. The appendix is normal.
2. No bowel obstruction. Detailed bowel evaluation is limited in the
absence of enteric contrast.

## 2021-08-11 ENCOUNTER — Encounter: Payer: Self-pay | Admitting: Gastroenterology
